# Patient Record
Sex: Male | Born: 1980 | Race: Black or African American | Hispanic: No | Marital: Single | Smoking: Heavy tobacco smoker
Health system: Southern US, Community
[De-identification: ages and names within clinical notes are randomized; demographics above are authoritative.]

---

## 2017-12-01 ENCOUNTER — Emergency Department (HOSPITAL_COMMUNITY)
Admission: EM | Admit: 2017-12-01 | Discharge: 2017-12-02 | Disposition: A | Discharge status: Home / Self Care | Payer: Self-pay | Attending: Emergency Medicine | Admitting: Emergency Medicine

## 2017-12-01 ENCOUNTER — Encounter (HOSPITAL_COMMUNITY): Payer: Self-pay | Admitting: Emergency Medicine

## 2017-12-01 ENCOUNTER — Emergency Department (HOSPITAL_COMMUNITY): Payer: Self-pay

## 2017-12-01 DIAGNOSIS — Z5321 Procedure and treatment not carried out due to patient leaving prior to being seen by health care provider: Secondary | ICD-10-CM | POA: Insufficient documentation

## 2017-12-01 DIAGNOSIS — R079 Chest pain, unspecified: Secondary | ICD-10-CM | POA: Insufficient documentation

## 2017-12-01 DIAGNOSIS — R109 Unspecified abdominal pain: Secondary | ICD-10-CM | POA: Insufficient documentation

## 2017-12-01 DIAGNOSIS — R232 Flushing: Secondary | ICD-10-CM | POA: Insufficient documentation

## 2017-12-01 DIAGNOSIS — R110 Nausea: Secondary | ICD-10-CM | POA: Insufficient documentation

## 2017-12-01 LAB — BASIC METABOLIC PANEL
ANION GAP: 14 (ref 5–15)
BUN: 8 mg/dL (ref 6–20)
CO2: 24 mmol/L (ref 22–32)
Calcium: 9.6 mg/dL (ref 8.9–10.3)
Chloride: 99 mmol/L (ref 98–111)
Creatinine, Ser: 1.04 mg/dL (ref 0.61–1.24)
GLUCOSE: 93 mg/dL (ref 70–99)
Potassium: 3.4 mmol/L — ABNORMAL LOW (ref 3.5–5.1)
Sodium: 137 mmol/L (ref 135–145)

## 2017-12-01 LAB — URINALYSIS, ROUTINE W REFLEX MICROSCOPIC
BILIRUBIN URINE: NEGATIVE
Bacteria, UA: NONE SEEN
GLUCOSE, UA: NEGATIVE mg/dL
KETONES UR: NEGATIVE mg/dL
LEUKOCYTES UA: NEGATIVE
NITRITE: NEGATIVE
PH: 6 (ref 5.0–8.0)
Protein, ur: NEGATIVE mg/dL
SPECIFIC GRAVITY, URINE: 1.01 (ref 1.005–1.030)

## 2017-12-01 LAB — I-STAT TROPONIN, ED: TROPONIN I, POC: 0.01 ng/mL (ref 0.00–0.08)

## 2017-12-01 LAB — CBC
HCT: 44.3 % (ref 39.0–52.0)
HEMOGLOBIN: 14.2 g/dL (ref 13.0–17.0)
MCH: 30.5 pg (ref 26.0–34.0)
MCHC: 32.1 g/dL (ref 30.0–36.0)
MCV: 95.3 fL (ref 80.0–100.0)
NRBC: 0 % (ref 0.0–0.2)
PLATELETS: 269 10*3/uL (ref 150–400)
RBC: 4.65 MIL/uL (ref 4.22–5.81)
RDW: 12.7 % (ref 11.5–15.5)
WBC: 14.1 10*3/uL — ABNORMAL HIGH (ref 4.0–10.5)

## 2017-12-01 LAB — LIPASE, BLOOD: Lipase: 28 U/L (ref 11–51)

## 2017-12-01 NOTE — ED Triage Notes (Signed)
Pt presents with CP and abd pain with sudden onset 30 mins PTA; pt states he was sitting eating dinner when he felt warm all over, became sob, and nauseated; got startled and came to the ER

## 2017-12-02 NOTE — ED Notes (Addendum)
No answer for vital recheck 

## 2019-08-30 IMAGING — DX DG CHEST 2V
2 series · 2 of 2 positions shown · non-contrast
Comparison: None.

CLINICAL DATA: Chest pain

EXAM:
CHEST - 2 VIEW

[chest pa]
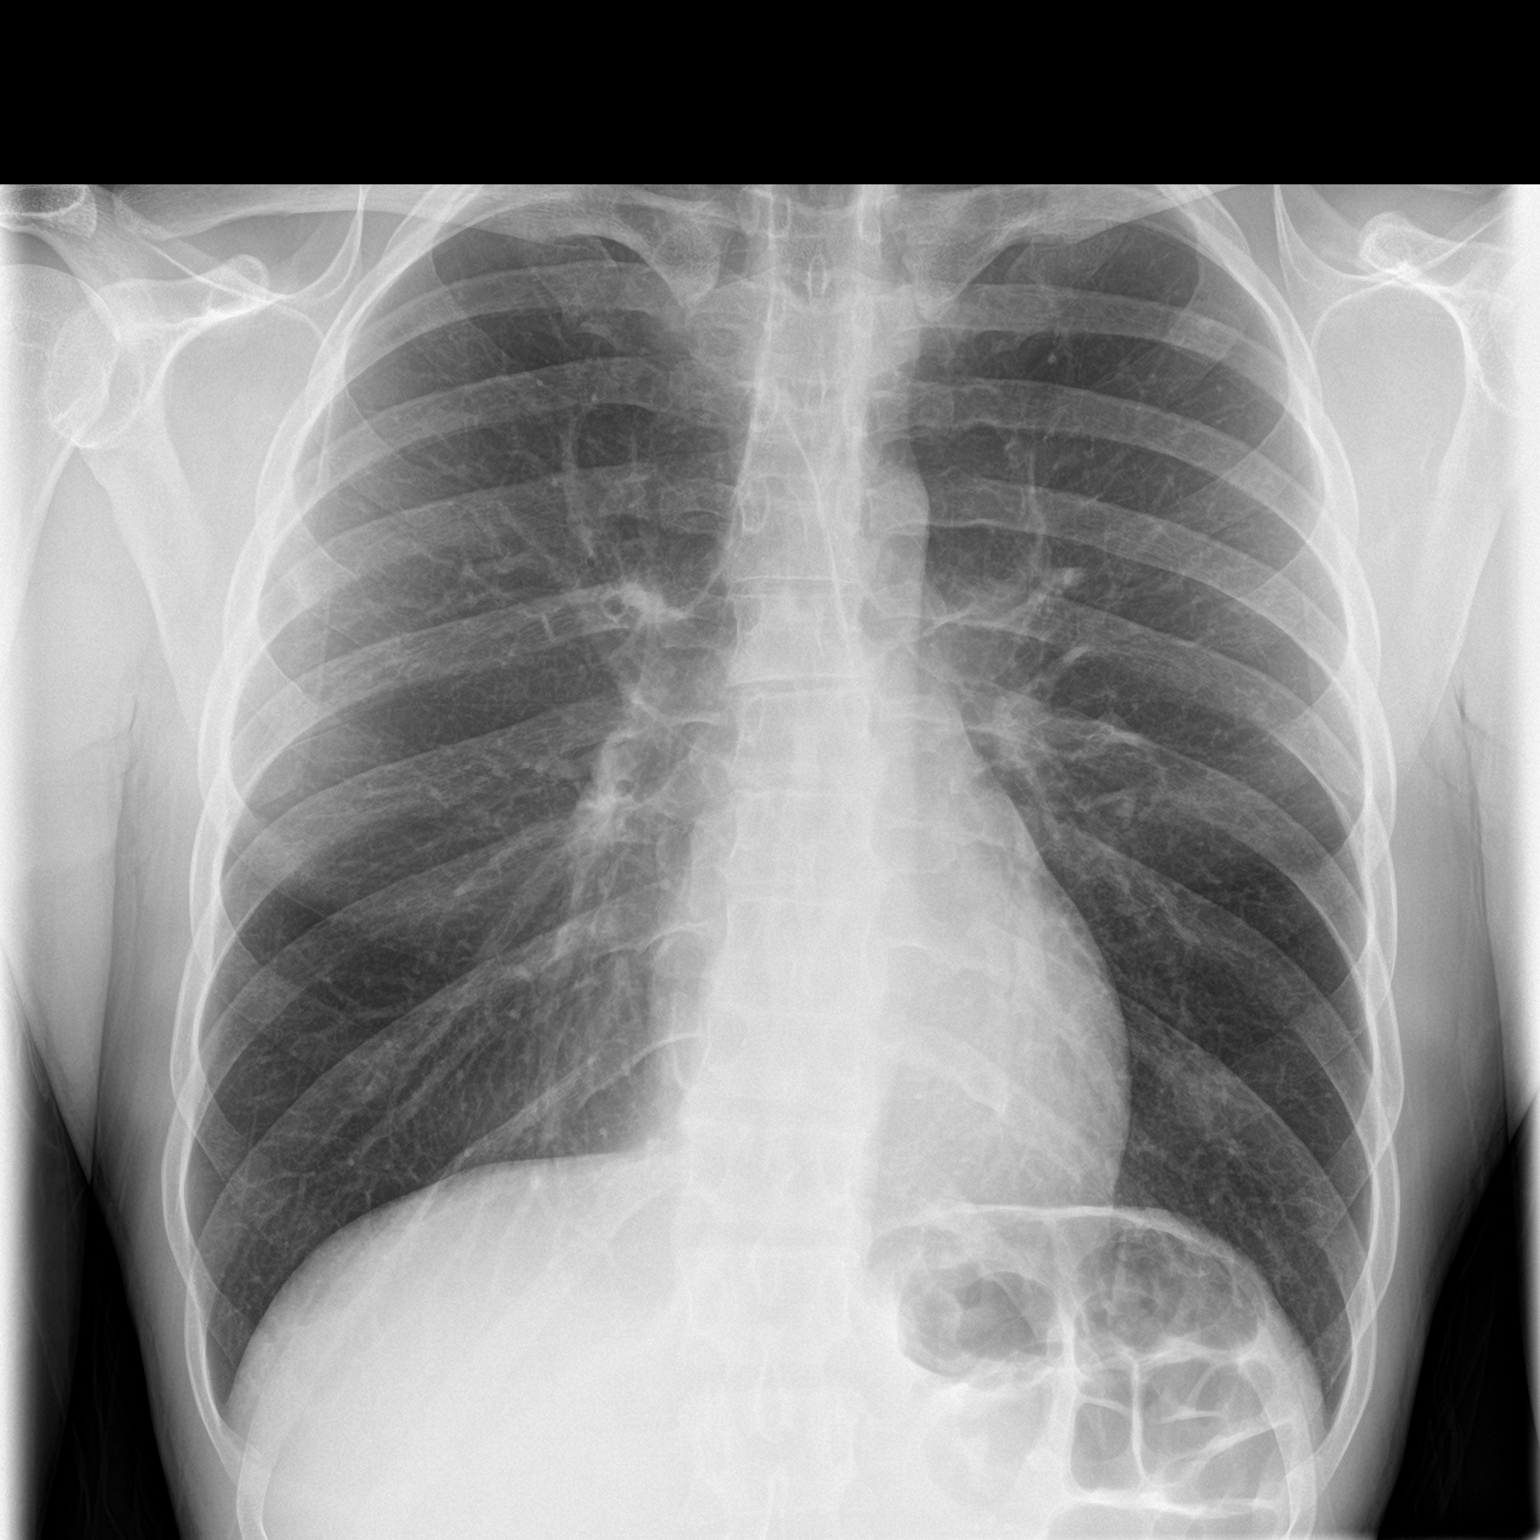

[chest lat]
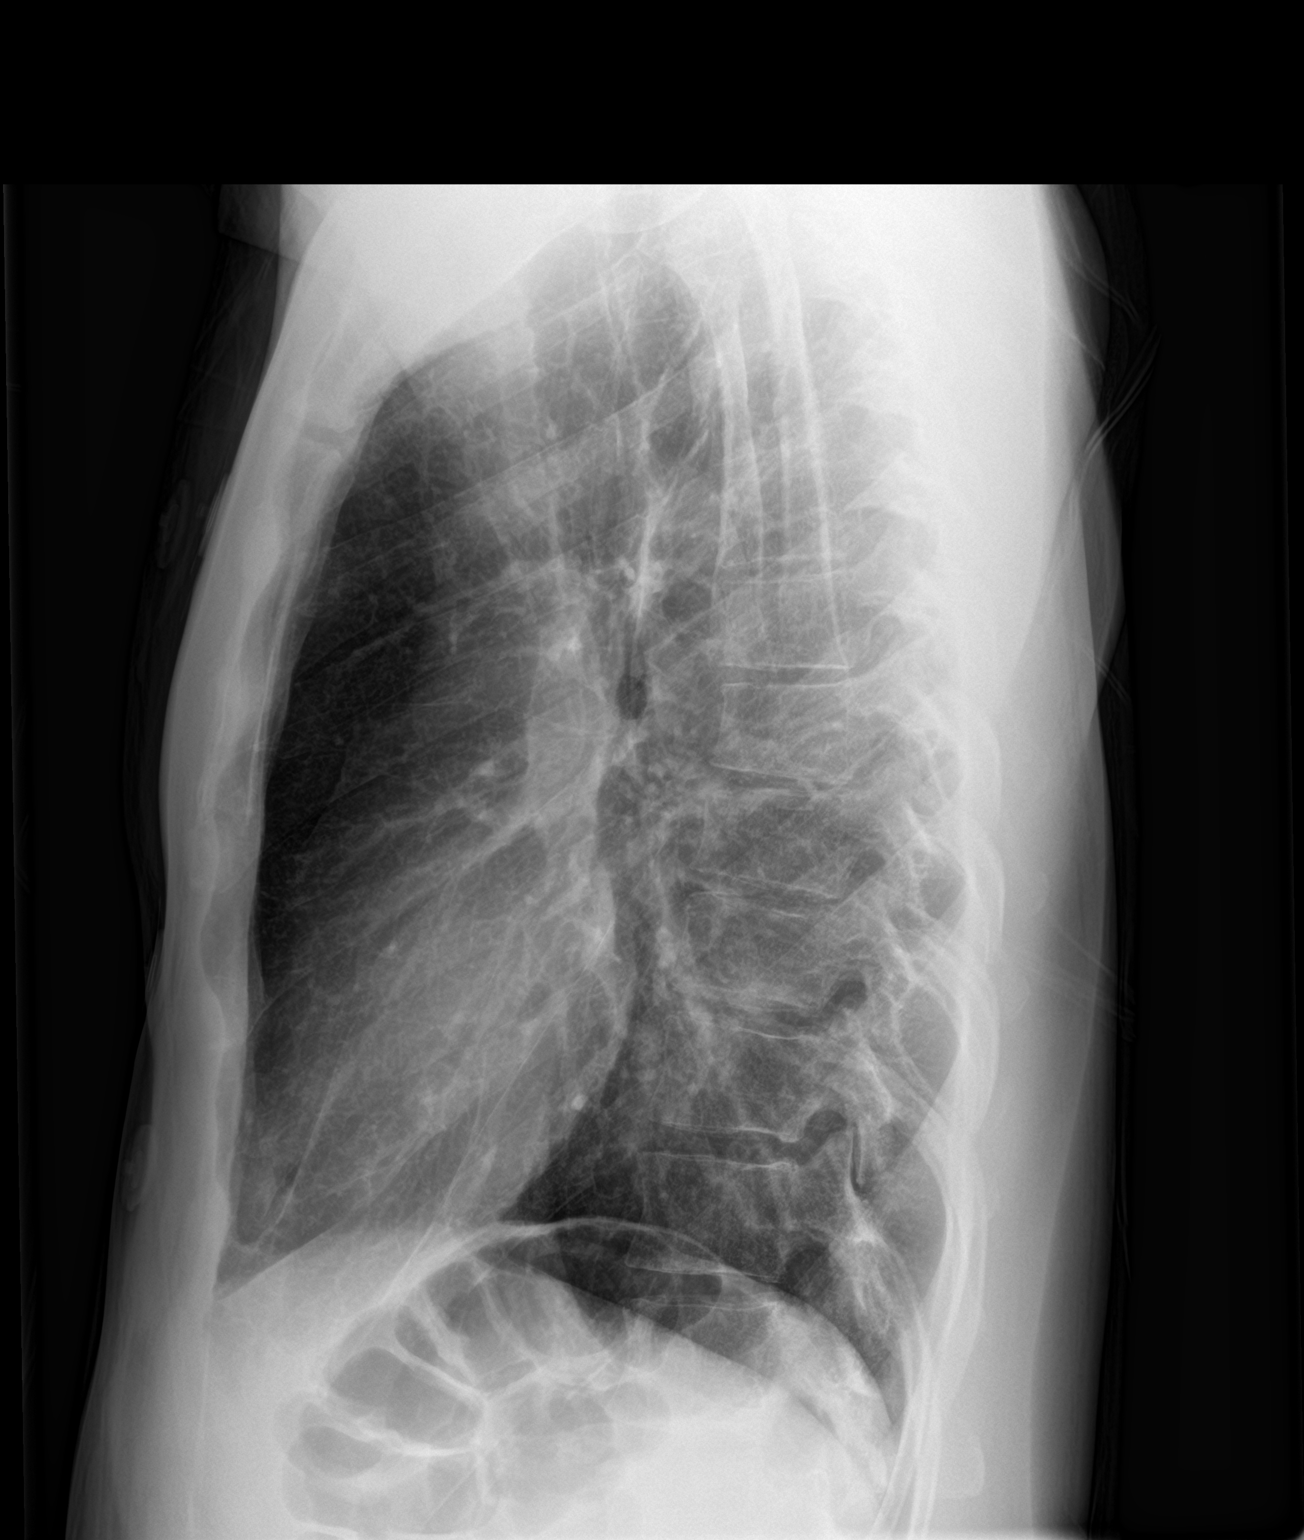

[2 of 2 positions shown; findings below may reference images not displayed]

FINDINGS: Heart and mediastinal contours are within normal limits. No focal
opacities or effusions. No acute bony abnormality.
IMPRESSION: No active cardiopulmonary disease.
